# Patient Record
Sex: Male | Born: 1995 | Race: White | Hispanic: Yes | Marital: Single | Smoking: Never smoker
Health system: Southern US, Community
[De-identification: ages and names within clinical notes are randomized; demographics above are authoritative.]

## PROBLEM LIST (undated history)

## (undated) DIAGNOSIS — J45909 Unspecified asthma, uncomplicated: Secondary | ICD-10-CM

## (undated) HISTORY — PX: WISDOM TOOTH EXTRACTION: SHX21

## (undated) HISTORY — DX: Unspecified asthma, uncomplicated: J45.909

---
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
## 2021-03-06 ENCOUNTER — Other Ambulatory Visit: Payer: Self-pay

## 2021-03-06 ENCOUNTER — Ambulatory Visit (HOSPITAL_COMMUNITY)
Admission: EM | Admit: 2021-03-06 | Discharge: 2021-03-06 | Disposition: A | Discharge status: Home / Self Care | Payer: BC Managed Care – PPO

## 2021-03-06 ENCOUNTER — Encounter (HOSPITAL_COMMUNITY): Payer: Self-pay

## 2021-03-06 DIAGNOSIS — R1013 Epigastric pain: Secondary | ICD-10-CM
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
## 2022-09-02 ENCOUNTER — Encounter (HOSPITAL_BASED_OUTPATIENT_CLINIC_OR_DEPARTMENT_OTHER): Payer: Self-pay | Admitting: Emergency Medicine

## 2022-09-02 ENCOUNTER — Emergency Department (HOSPITAL_BASED_OUTPATIENT_CLINIC_OR_DEPARTMENT_OTHER)
Admission: EM | Admit: 2022-09-02 | Discharge: 2022-09-02 | Disposition: A | Discharge status: Home / Self Care | Payer: No Typology Code available for payment source | Attending: Emergency Medicine | Admitting: Emergency Medicine

## 2022-09-02 ENCOUNTER — Emergency Department (HOSPITAL_BASED_OUTPATIENT_CLINIC_OR_DEPARTMENT_OTHER): Payer: No Typology Code available for payment source

## 2022-09-02 ENCOUNTER — Other Ambulatory Visit: Payer: Self-pay

## 2022-09-02 DIAGNOSIS — R1031 Right lower quadrant pain: Secondary | ICD-10-CM | POA: Insufficient documentation

## 2022-09-02 DIAGNOSIS — R197 Diarrhea, unspecified: Secondary | ICD-10-CM | POA: Diagnosis not present

## 2022-09-02 DIAGNOSIS — R000 Tachycardia, unspecified: Secondary | ICD-10-CM | POA: Insufficient documentation

## 2022-09-02 DIAGNOSIS — R630 Anorexia: Secondary | ICD-10-CM | POA: Diagnosis not present

## 2022-09-02 DIAGNOSIS — J45909 Unspecified asthma, uncomplicated: Secondary | ICD-10-CM | POA: Insufficient documentation

## 2022-09-02 HISTORY — DX: Unspecified asthma, uncomplicated: J45.909

## 2022-09-02 LAB — CBC WITH DIFFERENTIAL/PLATELET
Abs Immature Granulocytes: 0.03 10*3/uL (ref 0.00–0.07)
Basophils Absolute: 0 10*3/uL (ref 0.0–0.1)
Basophils Relative: 0 %
Eosinophils Absolute: 0.1 10*3/uL (ref 0.0–0.5)
Eosinophils Relative: 2 %
HCT: 44.3 % (ref 39.0–52.0)
Hemoglobin: 15.3 g/dL (ref 13.0–17.0)
Immature Granulocytes: 0 %
Lymphocytes Relative: 32 %
Lymphs Abs: 3 10*3/uL (ref 0.7–4.0)
MCH: 30.2 pg (ref 26.0–34.0)
MCHC: 34.5 g/dL (ref 30.0–36.0)
MCV: 87.4 fL (ref 80.0–100.0)
Monocytes Absolute: 1 10*3/uL (ref 0.1–1.0)
Monocytes Relative: 10 %
Neutro Abs: 5.1 10*3/uL (ref 1.7–7.7)
Neutrophils Relative %: 56 %
Platelets: 283 10*3/uL (ref 150–400)
RBC: 5.07 MIL/uL (ref 4.22–5.81)
RDW: 12.3 % (ref 11.5–15.5)
WBC: 9.3 10*3/uL (ref 4.0–10.5)
nRBC: 0 % (ref 0.0–0.2)

## 2022-09-02 LAB — URINALYSIS, ROUTINE W REFLEX MICROSCOPIC
Bilirubin Urine: NEGATIVE
Glucose, UA: NEGATIVE mg/dL
Hgb urine dipstick: NEGATIVE
Ketones, ur: NEGATIVE mg/dL
Leukocytes,Ua: NEGATIVE
Nitrite: NEGATIVE
Protein, ur: 30 mg/dL — AB
Specific Gravity, Urine: >=1.03 (ref 1.005–1.030)
pH: 6 (ref 5.0–8.0)

## 2022-09-02 LAB — URINALYSIS, MICROSCOPIC (REFLEX)

## 2022-09-02 LAB — COMPREHENSIVE METABOLIC PANEL
ALT: 23 U/L (ref 0–44)
AST: 20 U/L (ref 15–41)
Albumin: 4.4 g/dL (ref 3.5–5.0)
Alkaline Phosphatase: 49 U/L (ref 38–126)
Anion gap: 9 (ref 5–15)
BUN: 15 mg/dL (ref 6–20)
CO2: 27 mmol/L (ref 22–32)
Calcium: 9.1 mg/dL (ref 8.9–10.3)
Chloride: 101 mmol/L (ref 98–111)
Creatinine, Ser: 1.02 mg/dL (ref 0.61–1.24)
GFR, Estimated: >60 mL/min (ref >60)
Glucose, Bld: 80 mg/dL (ref 70–99)
Potassium: 3.6 mmol/L (ref 3.5–5.1)
Sodium: 137 mmol/L (ref 135–145)
Total Bilirubin: 0.8 mg/dL (ref 0.3–1.2)
Total Protein: 7.5 g/dL (ref 6.5–8.1)

## 2022-09-02 LAB — LIPASE, BLOOD: Lipase: 47 U/L (ref 11–51)

## 2022-09-02 MED ORDER — SODIUM CHLORIDE 0.9 % IV BOLUS
1000.0000 mL | Freq: Once | INTRAVENOUS | Status: AC
  Administered 2022-09-02: 1000 mL via INTRAVENOUS

## 2022-09-02 MED ORDER — IOHEXOL 300 MG/ML  SOLN
100.0000 mL | Freq: Once | INTRAMUSCULAR | Status: AC | PRN
  Administered 2022-09-02: 100 mL via INTRAVENOUS

## 2022-09-02 NOTE — ED Triage Notes (Signed)
Pt reports intermittent RLQ ABD pain x 2 weeks, has been taking Pepto & Tums to treat at home, reports intermittent diarrhea started a week ago, denies n/v, fever  Tender on rt side with palpation but no rebound tenderness noted

## 2022-09-02 NOTE — Discharge Instructions (Addendum)
Return to the ED with any new or worsening signs or symptoms or new features to your abdominal pain Please follow-up with primary care doctor I referred you.  Please call and make an appointment to be seen. Please rest and take a break from doing outside yard work over the next 2 days

## 2022-09-02 NOTE — ED Provider Notes (Signed)
[facility] Provider Note   CSN: [id number] Arrival date & time: 09/02/22  1710     History  Chief Complaint  Patient presents with   Abdominal Pain    Ruben Blevins is a 27 y.o. male with medical history of asthma.  The patient presents to the ED for evaluation of abdominal pain.  He reports that 1.5 weeks ago he developed abdominal pain located primarily in his right lower quadrant.  He states that the pain has been persistent ever since onset 1 and half weeks ago.  He states that he has taken ibuprofen, Tums, Pepto-Bismol with minimal relief of symptoms.  He states that this morning he woke up and noticed bruising in his right lower quadrant with prompted him to seek medical care.  He denies any excessive NSAID use, alcohol use.  He denies fevers at home.  He does endorse diarrhea and states that he has had persistent diarrhea for the last 1 and half weeks which is nonbloody.  He states he also has decreased appetite.  He denies any overt nausea or vomiting, fevers, chest pain, shortness of breath, dysuria, flank pain.  Denies history of kidney stones.   Abdominal Pain Associated symptoms: diarrhea   Associated symptoms: no chest pain, no dysuria, no fever, no nausea, no shortness of breath and no vomiting        Home Medications Prior to Admission medications   Not on File      Allergies    Avocado    Review of Systems   Review of Systems  Constitutional:  Negative for fever.  Respiratory:  Negative for shortness of breath.   Cardiovascular:  Negative for chest pain.  Gastrointestinal:  Positive for abdominal pain and diarrhea. Negative for blood in stool, nausea and vomiting.  Genitourinary:  Negative for dysuria and flank pain.  All other systems reviewed and are negative.   Physical Exam Updated Vital Signs BP 133/68   Pulse 89   Temp 98 F (36.7 C)   Resp 16   Ht 5\' 5"  (1.651 m)   Wt 77.1 kg   SpO2 100%   BMI  28.29 kg/m  Physical Exam Vitals and nursing note reviewed.  Constitutional:      General: He is not in acute distress.    Appearance: Normal appearance. He is not ill-appearing, toxic-appearing or diaphoretic.  HENT:     Head: Normocephalic and atraumatic.     Nose: Nose normal.     Mouth/Throat:     Mouth: Mucous membranes are moist.     Pharynx: Oropharynx is clear.  Eyes:     Extraocular Movements: Extraocular movements intact.     Conjunctiva/sclera: Conjunctivae normal.     Pupils: Pupils are equal, round, and reactive to light.  Cardiovascular:     Rate and Rhythm: Normal rate and regular rhythm.  Pulmonary:     Effort: Pulmonary effort is normal.     Breath sounds: Normal breath sounds. No wheezing.  Abdominal:     General: Abdomen is flat. Bowel sounds are normal. There is no distension.     Palpations: Abdomen is soft.     Tenderness: There is abdominal tenderness in the right lower quadrant. There is no right CVA tenderness, left CVA tenderness, guarding or rebound.  Musculoskeletal:     Cervical back: Normal range of motion and neck supple. No tenderness.  Skin:    General: Skin is warm and dry.  Capillary Refill: Capillary refill takes less than 2 seconds.  Neurological:     Mental Status: He is alert and oriented to person, place, and time.     ED Results / Procedures / Treatments   Labs (all labs ordered are listed, but only abnormal results are displayed) Labs Reviewed  URINALYSIS, ROUTINE W REFLEX MICROSCOPIC - Abnormal; Notable for the following components:      Result Value   Protein, ur 30 (*)    All other components within normal limits  URINALYSIS, MICROSCOPIC (REFLEX) - Abnormal; Notable for the following components:   Bacteria, UA RARE (*)    All other components within normal limits  URINE CULTURE  CBC WITH DIFFERENTIAL/PLATELET  COMPREHENSIVE METABOLIC PANEL  LIPASE, BLOOD    EKG None  Radiology CT ABDOMEN PELVIS W  CONTRAST  Result Date: 09/02/2022 CLINICAL DATA:  Pain right lower quadrant of abdomen, intermittent diarrhea EXAM: CT ABDOMEN AND PELVIS WITH CONTRAST TECHNIQUE: Multidetector CT imaging of the abdomen and pelvis was performed using the standard protocol following bolus administration of intravenous contrast. RADIATION DOSE REDUCTION: This exam was performed according to the departmental dose-optimization program which includes automated exposure control, adjustment of the mA and/or kV according to patient size and/or use of iterative reconstruction technique. CONTRAST:  OMNIPAQUE IOHEXOL 300 MG/ML  SOLN COMPARISON:  None Available. FINDINGS: Lower chest: No acute findings are seen. Hepatobiliary: No focal abnormalities are seen in liver. Gallbladder is not distended. There is no dilation of bile ducts. Pancreas: No focal abnormalities are seen. Spleen: Unremarkable. Adrenals/Urinary Tract: Adrenals are unremarkable. There is no hydronephrosis. There are no renal or ureteral stones. There is mild diffuse wall thickening in the bladder which may be due to incomplete distention. Less likely possibility would be cystitis. There is no inflammatory stranding adjacent to the bladder. Stomach/Bowel: Stomach is unremarkable. Small bowel loops are not dilated. Appendix is unremarkable. There is no significant wall thickening in colon. There is no pericolic stranding. Vascular/Lymphatic: Unremarkable. Reproductive: Unremarkable. Other: There is no ascites or pneumoperitoneum. Umbilical hernia containing fat is seen. Musculoskeletal: No acute findings are seen. IMPRESSION: There is no evidence of intestinal obstruction or pneumoperitoneum. There is no hydronephrosis. Appendix is not dilated. There is mild diffuse wall thickening in the urinary bladder which may be due to incomplete distention. Less likely possibility would be cystitis. Umbilical hernia containing fat is seen. Electronically Signed   By: Ernie Avena M.D.   On: 09/02/2022 19:04    Procedures Procedures   Medications Ordered in ED Medications  iohexol (OMNIPAQUE) 300 MG/ML solution 100 mL (100 mLs Intravenous Contrast Given 09/02/22 1832)  sodium chloride 0.9 % bolus 1,000 mL ( Intravenous Stopped 09/02/22 1911)    ED Course/ Medical Decision Making/ A&P  Medical Decision Making Amount and/or Complexity of Data Reviewed Labs: ordered. Radiology: ordered.  Risk Prescription drug management.   27 year old male presents to ED for evaluation.  Please see HPI for further details.  On examination patient is afebrile and slightly tachycardic at 103.  Lung sounds are clear bilaterally and he is not hypoxic.  His abdomen is soft and compressible throughout with right-sided tenderness and bruising to his right lower quadrant.  Negative CVA tenderness bilaterally.  Nontoxic in appearance.  Patient is a 1 L fluid for tachycardia.  CBC without cytosis or anemia.  Metabolic panel without electrolyte derangement, no elevated LFTs, anion gap 9, no elevated creatinine.  Patient lipase WNL.  Patient urinalysis unremarkable for all other than protein.  CT scan of abdomen pelvis shows no evidence of appendicitis.  There is evidence of possible urinary bladder wall thickening consistent with cystitis but patient denies dysuria, flank pain.  His urinalysis unremarkable.  We will culture the patient urine to ensure there is no underlying infection.  At this time, the patient will be discharged home.  He will follow-up with his PCP.  Patient will be advised to return to the ED with any new or worsening signs or symptoms and he voiced understanding.  He has had all of his questions answered to his satisfaction.  He is stable to discharge home.   Final Clinical Impression(s) / ED Diagnoses Final diagnoses:  Right lower quadrant abdominal pain    Rx / DC Orders ED Discharge Orders     None         Clent Ridges 09/02/22  2052    Terald Sleeper, MD 09/02/22 [phone]

## 2022-09-04 LAB — URINE CULTURE: Culture: NO GROWTH
